# Patient Record
Sex: Female | Born: 1995 | Race: White | Hispanic: No | Marital: Married | Smoking: Current every day smoker
Health system: Southern US, Community
[De-identification: ages and names within clinical notes are randomized; demographics above are authoritative.]

## PROBLEM LIST (undated history)

## (undated) DIAGNOSIS — D72829 Elevated white blood cell count, unspecified: Secondary | ICD-10-CM

## (undated) DIAGNOSIS — K2970 Gastritis, unspecified, without bleeding: Secondary | ICD-10-CM

## (undated) DIAGNOSIS — Z87898 Personal history of other specified conditions: Secondary | ICD-10-CM

## (undated) DIAGNOSIS — F603 Borderline personality disorder: Secondary | ICD-10-CM

## (undated) DIAGNOSIS — M549 Dorsalgia, unspecified: Secondary | ICD-10-CM

## (undated) DIAGNOSIS — F4310 Post-traumatic stress disorder, unspecified: Secondary | ICD-10-CM

## (undated) DIAGNOSIS — F32A Depression, unspecified: Secondary | ICD-10-CM

## (undated) DIAGNOSIS — G932 Benign intracranial hypertension: Secondary | ICD-10-CM

## (undated) DIAGNOSIS — F419 Anxiety disorder, unspecified: Secondary | ICD-10-CM

## (undated) HISTORY — DX: Benign intracranial hypertension: G93.2

## (undated) HISTORY — DX: Depression, unspecified: F32.A

## (undated) HISTORY — PX: CHOLECYSTECTOMY: SHX55

## (undated) HISTORY — DX: Elevated white blood cell count, unspecified: D72.829

## (undated) HISTORY — DX: Gastritis, unspecified, without bleeding: K29.70

## (undated) HISTORY — DX: Borderline personality disorder: F60.3

## (undated) HISTORY — DX: Post-traumatic stress disorder, unspecified: F43.10

## (undated) HISTORY — DX: Personal history of other specified conditions: Z87.898

## (undated) HISTORY — DX: Anxiety disorder, unspecified: F41.9

## (undated) HISTORY — PX: LUMBAR PUNCTURE: SHX1985

---
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
## 2016-10-08 ENCOUNTER — Encounter (HOSPITAL_COMMUNITY): Payer: Self-pay

## 2016-10-08 ENCOUNTER — Emergency Department (HOSPITAL_COMMUNITY)
Admission: EM | Admit: 2016-10-08 | Discharge: 2016-10-08 | Disposition: A | Discharge status: Home / Self Care | Payer: Medicaid Other | Attending: Emergency Medicine | Admitting: Emergency Medicine

## 2016-10-08 ENCOUNTER — Emergency Department (HOSPITAL_COMMUNITY): Payer: Medicaid Other

## 2016-10-08 DIAGNOSIS — F17210 Nicotine dependence, cigarettes, uncomplicated: Secondary | ICD-10-CM | POA: Diagnosis not present

## 2016-10-08 DIAGNOSIS — O99331 Smoking (tobacco) complicating pregnancy, first trimester: Secondary | ICD-10-CM | POA: Insufficient documentation

## 2016-10-08 DIAGNOSIS — R102 Pelvic and perineal pain: Secondary | ICD-10-CM | POA: Diagnosis not present

## 2016-10-08 DIAGNOSIS — O039 Complete or unspecified spontaneous abortion without complication: Secondary | ICD-10-CM | POA: Diagnosis not present

## 2016-10-08 DIAGNOSIS — Z3A08 8 weeks gestation of pregnancy: Secondary | ICD-10-CM | POA: Insufficient documentation

## 2016-10-08 DIAGNOSIS — O209 Hemorrhage in early pregnancy, unspecified: Secondary | ICD-10-CM | POA: Diagnosis present

## 2016-10-08 DIAGNOSIS — N939 Abnormal uterine and vaginal bleeding, unspecified: Secondary | ICD-10-CM

## 2016-10-08 LAB — BASIC METABOLIC PANEL
Anion gap: 11 (ref 5–15)
BUN: 5 mg/dL — ABNORMAL LOW (ref 6–20)
CO2: 19 mmol/L — ABNORMAL LOW (ref 22–32)
Calcium: 9.3 mg/dL (ref 8.9–10.3)
Chloride: 107 mmol/L (ref 101–111)
Creatinine, Ser: 0.61 mg/dL (ref 0.44–1.00)
GFR calc Af Amer: >60 mL/min (ref >60)
GFR calc non Af Amer: >60 mL/min (ref >60)
Glucose, Bld: 89 mg/dL (ref 65–99)
Potassium: 4.2 mmol/L (ref 3.5–5.1)
Sodium: 137 mmol/L (ref 135–145)

## 2016-10-08 LAB — WET PREP, GENITAL
Sperm: NONE SEEN
Trich, Wet Prep: NONE SEEN
Yeast Wet Prep HPF POC: NONE SEEN

## 2016-10-08 LAB — CBC
HCT: 40.1 % (ref 36.0–46.0)
Hemoglobin: 13.2 g/dL (ref 12.0–15.0)
MCH: 30.6 pg (ref 26.0–34.0)
MCHC: 32.9 g/dL (ref 30.0–36.0)
MCV: 92.8 fL (ref 78.0–100.0)
Platelets: 224 10*3/uL (ref 150–400)
RBC: 4.32 MIL/uL (ref 3.87–5.11)
RDW: 12.7 % (ref 11.5–15.5)
WBC: 8.5 10*3/uL (ref 4.0–10.5)

## 2016-10-08 LAB — ABO/RH: ABO/RH(D): B POS

## 2016-10-08 LAB — HCG, QUANTITATIVE, PREGNANCY: hCG, Beta Chain, Quant, S: 1313 m[IU]/mL — ABNORMAL HIGH (ref <5)

## 2016-10-08 LAB — POC URINE PREG, ED: Preg Test, Ur: POSITIVE — AB

## 2016-10-08 MED ORDER — HYDROCODONE-ACETAMINOPHEN 5-325 MG PO TABS: 1.0000 | ORAL_TABLET | ORAL | 0 refills | Status: DC | PRN

## 2016-10-08 MED ORDER — OXYCODONE-ACETAMINOPHEN 5-325 MG PO TABS
2.0000 | ORAL_TABLET | Freq: Once | ORAL | Status: AC
Start: 2016-10-08 — End: 2016-10-08
  Administered 2016-10-08: 2 via ORAL
  Filled 2016-10-08: qty 2

## 2016-10-08 MED ORDER — IBUPROFEN 600 MG PO TABS: 600.0000 mg | ORAL_TABLET | Freq: Four times a day (QID) | ORAL | 0 refills | Status: DC | PRN

## 2016-10-08 MED ORDER — HYDROCODONE-ACETAMINOPHEN 5-325 MG PO TABS: 2.0000 | ORAL_TABLET | ORAL | 0 refills | Status: DC | PRN

## 2016-10-08 MED ORDER — MISOPROSTOL 200 MCG PO TABS
600.0000 ug | ORAL_TABLET | Freq: Once | ORAL | Status: AC
Start: 2016-10-08 — End: 2016-10-08
  Administered 2016-10-08: 600 ug via BUCCAL
  Filled 2016-10-08: qty 3

## 2016-10-08 MED ORDER — NAPROXEN 250 MG PO TABS
500.0000 mg | ORAL_TABLET | Freq: Once | ORAL | Status: AC
  Administered 2016-10-08: 500 mg via ORAL
  Filled 2016-10-08: qty 2

## 2016-10-08 NOTE — ED Notes (Signed)
Patient transported to Ultrasound 

## 2016-10-08 NOTE — ED Provider Notes (Signed)
[facility]-EMERGENCY DEPT Provider Note   CSN: [id number] Arrival date & time: 10/08/16  1237   By signing my name below, I, Soijett Blue, attest that this documentation has been prepared under the direction and in the presence of Shaune Pollack, MD. Electronically Signed: Soijett Blue, ED Scribe. 10/08/16. 2:42 PM.  History   Chief Complaint No chief complaint on file.   HPI Angela Boyd is a 21 y.o. female who presents to the Emergency Department complaining of vaginal bleeding s/p sexual intercourse onset yesterday. She notes that she had light vaginal bleeding x yesterday morning that was heavy initially and then resolved. Pt reports associated intermittent lower abdominal cramping, passing small clots, and dyspareunia. Pt has not tried any medications for the relief of her symptoms. She notes that she is approximately [redacted] weeks pregnant and has had recent life stressors. 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 She notes that this is her 4th pregnancy, with 1 live child, and 2 miscarriages. Pt reports that she had an [place] at 8 weeks with no abnormal results. She reports that her current symptoms are not similar to when she was experiencing her miscarriages. Pt states that she had irregular menstrual cycles and notes that she has a hx of anemia in pregnancy. She states that she was dx with an UTI and treated with abx several weeks ago. She denies fever, chills, dysuria, flank pain, leg swelling, and any other symptoms.     The history is provided by the patient. No language interpreter was used.    Past Medical History:  Diagnosis Date  . Back pain     There are no active problems to display for this patient.   History reviewed. No pertinent surgical history.  OB History    Gravida Para Term Preterm AB Living   1             SAB TAB Ectopic Multiple Live Births                   Home Medications     Prior to Admission medications   Medication Sig Start Date End Date Taking? Authorizing Provider  amoxicillin (AMOXIL) 500 MG capsule Take 500 mg by mouth 3 (three) times daily.      [provider]  ARIPiprazole (ABILIFY) 5 MG tablet Take 5 mg by mouth at bedtime.      [provider]  cyclobenzaprine (FLEXERIL) 10 MG tablet Take 1/2 to one tab po TID prn back pain 05/19/11   Worthy Rancher, PA-C  HYDROcodone-acetaminophen (NORCO/VICODIN) 5-325 MG tablet Take 1-2 tablets by mouth every 4 (four) hours as needed for moderate pain or severe pain. 10/08/16   Shaune Pollack, MD  ibuprofen (ADVIL,MOTRIN) 600 MG tablet Take 1 tablet (600 mg total) by mouth every 6 (six) hours as needed. 10/08/16   Shaune Pollack, MD  Pediatric Multivit-Minerals-C (FLINTSTONES GUMMIES PLUS) CHEW Chew 2 each by mouth daily.      [provider]    Family History History reviewed. No pertinent family history.  Social History Social History  Substance Use Topics  . Smoking status: Current Every Day Smoker    Packs/day: 0.50    Types: Cigarettes  . Smokeless tobacco: Never Used  . Alcohol use No     Allergies   Patient has no known allergies.   Review of Systems Review of Systems  Constitutional: Negative for  chills and fever.  Cardiovascular: Negative for leg swelling.  Gastrointestinal: Positive for abdominal pain.  Genitourinary: Positive for dyspareunia, pelvic pain and vaginal bleeding. Negative for dysuria and flank pain.     Physical Exam Updated Vital Signs BP 114/75 (BP Location: Right Arm)   Pulse (!) 103   Temp 99 F (37.2 C) (Oral)   Resp 16   LMP 07/15/2016   SpO2 99%   Physical Exam  Constitutional: She is oriented to person, place, and time. She appears well-developed and well-nourished. No distress.  HENT:  Head: Normocephalic and atraumatic.  Eyes: EOM are normal.  Neck: Neck supple.  Cardiovascular: Normal rate, regular rhythm and normal  heart sounds.  Exam reveals no gallop and no friction rub.   No murmur heard. Pulmonary/Chest: Effort normal and breath sounds normal. No respiratory distress. She has no wheezes. She has no rales.  Abdominal: Soft. She exhibits no distension. There is tenderness (suprapubic).  Genitourinary:  Genitourinary Comments: Nurse chaperone present for exam. Cervical os closed but with moderate volume dark red blood in vaginal vault. Mild CMT. No discharge. No lacerations.  Musculoskeletal: Normal range of motion.  Neurological: She is alert and oriented to person, place, and time.  Skin: Skin is warm and dry.  Psychiatric: She has a normal mood and affect. Her behavior is normal.  Nursing note and vitals reviewed.    ED Treatments / Results  DIAGNOSTIC STUDIES: Oxygen Saturation is 99% on RA, nl by my interpretation.    COORDINATION OF CARE: 2:40 PM Discussed treatment plan with pt at bedside and pt agreed to plan.   Labs (all labs ordered are listed, but only abnormal results are displayed) Labs Reviewed  WET PREP, GENITAL - Abnormal; Notable for the following:       Result Value   Clue Cells Wet Prep HPF POC PRESENT (*)    WBC, Wet Prep HPF POC FEW (*)    All other components within normal limits  HCG, QUANTITATIVE, PREGNANCY - Abnormal; Notable for the following:    hCG, Beta Chain, Quant, S 1,313 (*)    All other components within normal limits  BASIC METABOLIC PANEL - Abnormal; Notable for the following:    CO2 19 (*)    BUN 5 (*)    All other components within normal limits  POC URINE PREG, ED - Abnormal; Notable for the following:    Preg Test, Ur POSITIVE (*)    All other components within normal limits  CBC  ABO/RH  GC/CHLAMYDIA PROBE AMP ([place]    EKG  EKG Interpretation None       Radiology [place] Ob Comp < 14 Wks  Result Date: 10/08/2016 CLINICAL DATA:  Vaginal bleeding and cramping since yesterday. Eight weeks pregnant. EXAM: OBSTETRIC <14  WK US AND TRANSVAGINAL OB US TECHNIQUE: Both transabdominal and transvaginal ultrasound examinations were performed for complete evaluation of the gestation as well as the maternal uterus, adnexal regions, and pelvic cul-de-sac. Transvaginal technique was performed to assess early pregnancy. COMPARISON:  09/12/2016 FINDINGS: Intrauterine gestational sac: Single Yolk sac:  Not visualized Embryo:  Visualized Cardiac Activity: Not Visualized. Heart Rate: Not detected CRL:  1.63 cm    8 w   0 d                  US EDC: 05/20/2017 Subchorionic hemorrhage:  None visualized. Maternal uterus/adnexae: Gestational sac is somewhat oblong and irregular in shape. Embryo is demonstrated but no detectable or  visualized cardiac activity. Findings compatible with failed pregnancy. no subchronic hemorrhage. No free fluid. No other adnexal abnormality. Right ovary measures 3.2 x 2.0 x 2.0 cm. Left ovary measures 3.1 x 2.1 x 2.1 cm. IMPRESSION: Abnormal appearing gestational sac with a fetal pole demonstrated a CRL of 1.63 cm. No detectable cardiac activity or heart rate. Findings meet definitive criteria for failed pregnancy. This follows SRU consensus guidelines: Diagnostic Criteria for Nonviable Pregnancy Early in the First Trimester. [facility] J [facility] [phone]. These results were called by telephone at the time of interpretation on 10/08/2016 at 4:58 pm to Dr. Shaune Pollack , who verbally acknowledged these results. Electronically Signed   By: Judie Petit.  Shick M.D.   On: 10/08/2016 17:00   US Ob Transvaginal  Result Date: 10/08/2016 CLINICAL DATA:  Vaginal bleeding and cramping since yesterday. Eight weeks pregnant. EXAM: OBSTETRIC <14 WK [place] AND TRANSVAGINAL OB US TECHNIQUE: Both transabdominal and transvaginal ultrasound examinations were performed for complete evaluation of the gestation as well as the maternal uterus, adnexal regions, and pelvic cul-de-sac. Transvaginal technique was performed to assess early pregnancy.  COMPARISON:  09/12/2016 FINDINGS: Intrauterine gestational sac: Single Yolk sac:  Not visualized Embryo:  Visualized Cardiac Activity: Not Visualized. Heart Rate: Not detected CRL:  1.63 cm    8 w   0 d                  [place] EDC: 05/20/2017 Subchorionic hemorrhage:  None visualized. Maternal uterus/adnexae: Gestational sac is somewhat oblong and irregular in shape. Embryo is demonstrated but no detectable or visualized cardiac activity. Findings compatible with failed pregnancy. no subchronic hemorrhage. No free fluid. No other adnexal abnormality. Right ovary measures 3.2 x 2.0 x 2.0 cm. Left ovary measures 3.1 x 2.1 x 2.1 cm. IMPRESSION: Abnormal appearing gestational sac with a fetal pole demonstrated a CRL of 1.63 cm. No detectable cardiac activity or heart rate. Findings meet definitive criteria for failed pregnancy. This follows SRU consensus guidelines: Diagnostic Criteria for Nonviable Pregnancy Early in the First Trimester. [facility] J Med [phone]. These results were called by telephone at the time of interpretation on 10/08/2016 at 4:58 pm to Dr. Shaune Pollack , who verbally acknowledged these results. Electronically Signed   By: Judie Petit.  Shick M.D.   On: 10/08/2016 17:00    Procedures Procedures (including critical care time)  Medications Ordered in ED Medications  oxyCODONE-acetaminophen (PERCOCET/ROXICET) 5-325 MG per tablet 2 tablet (2 tablets Oral Given 10/08/16 1731)  naproxen (NAPROSYN) tablet 500 mg (500 mg Oral Given 10/08/16 1731)  misoprostol (CYTOTEC) tablet 600 mcg (600 mcg Buccal Given 10/08/16 1803)     Initial Impression / Assessment and Plan / ED Course  I have reviewed the triage vital signs and the nursing notes.  Pertinent labs & imaging results that were available during my care of the patient were reviewed by me and considered in my medical decision making (see chart for details).   21 yo G4P1030 at estimated [redacted] wk GA here with abdominal pain, vaginal bleeding, and  cramping. Unfortunately, TVUS confirms failed, nonviable pregnancy. Suspect her pain, bleeding, and cramping is 2/2 beginning to pass the POC. No evidence of septic AB. Pt o.w HDS, no signs of significant bleeding. I discussed with Dr. Adrian Blackwater of OBGYN - will give cytotec oral dose here, refer for outpt OB follow-up. Discussed risks/benefits/side effects of tx, including need to monitor bleeding and pain. Pt given analgesia and meds. Good return precautions given. I sat with pt, husband  for some time to discuss and reassure them in setting of this news.  Final Clinical Impressions(s) / ED Diagnoses   Final diagnoses:  Vaginal bleeding  Miscarriage    New Prescriptions Discharge Medication List as of 10/08/2016  5:59 PM    START taking these medications   Details  HYDROcodone-acetaminophen (NORCO/VICODIN) 5-325 MG tablet Take 2 tablets by mouth every 4 (four) hours as needed for moderate pain or severe pain., Starting Tue 10/08/2016, Print    ibuprofen (ADVIL,MOTRIN) 600 MG tablet Take 1 tablet (600 mg total) by mouth every 6 (six) hours as needed., Starting Tue 10/08/2016, Print        I personally performed the services described in this documentation, which was scribed in my presence. The recorded information has been reviewed and is accurate.     Shaune Pollack, MD 10/09/16 1019

## 2016-10-08 NOTE — ED Triage Notes (Signed)
Pt reports she is approximately [redacted]weeks pregnant. Began having vaginal bleeding yesterday. Pt reports some abd cramping as well. Hx of miscarriage previously.

## 2016-10-08 NOTE — ED Notes (Signed)
Waiting for pharmacy to send medication

## 2016-10-09 LAB — GC/CHLAMYDIA PROBE AMP (~~LOC~~) NOT AT ARMC
Chlamydia: NEGATIVE
Neisseria Gonorrhea: NEGATIVE

## 2016-10-16 ENCOUNTER — Encounter (HOSPITAL_COMMUNITY): Payer: Self-pay | Admitting: *Deleted

## 2016-10-16 ENCOUNTER — Emergency Department (HOSPITAL_COMMUNITY)
Admission: EM | Admit: 2016-10-16 | Discharge: 2016-10-16 | Disposition: A | Discharge status: Home / Self Care | Payer: Medicaid Other | Attending: Emergency Medicine | Admitting: Emergency Medicine

## 2016-10-16 DIAGNOSIS — F17210 Nicotine dependence, cigarettes, uncomplicated: Secondary | ICD-10-CM | POA: Insufficient documentation

## 2016-10-16 DIAGNOSIS — O034 Incomplete spontaneous abortion without complication: Secondary | ICD-10-CM

## 2016-10-16 DIAGNOSIS — N939 Abnormal uterine and vaginal bleeding, unspecified: Secondary | ICD-10-CM | POA: Diagnosis present

## 2016-10-16 LAB — CBC WITH DIFFERENTIAL/PLATELET
Basophils Absolute: 0 10*3/uL (ref 0.0–0.1)
Basophils Relative: 0 %
Eosinophils Absolute: 0.4 10*3/uL (ref 0.0–0.7)
Eosinophils Relative: 4 %
HCT: 34.8 % — ABNORMAL LOW (ref 36.0–46.0)
Hemoglobin: 11.4 g/dL — ABNORMAL LOW (ref 12.0–15.0)
Lymphocytes Relative: 32 %
Lymphs Abs: 2.7 10*3/uL (ref 0.7–4.0)
MCH: 30.3 pg (ref 26.0–34.0)
MCHC: 32.8 g/dL (ref 30.0–36.0)
MCV: 92.6 fL (ref 78.0–100.0)
Monocytes Absolute: 0.4 10*3/uL (ref 0.1–1.0)
Monocytes Relative: 5 %
Neutro Abs: 5 10*3/uL (ref 1.7–7.7)
Neutrophils Relative %: 59 %
Platelets: 275 10*3/uL (ref 150–400)
RBC: 3.76 MIL/uL — ABNORMAL LOW (ref 3.87–5.11)
RDW: 12.7 % (ref 11.5–15.5)
WBC: 8.4 10*3/uL (ref 4.0–10.5)

## 2016-10-16 LAB — URINALYSIS, ROUTINE W REFLEX MICROSCOPIC
Bilirubin Urine: NEGATIVE
Glucose, UA: NEGATIVE mg/dL
Ketones, ur: NEGATIVE mg/dL
Leukocytes, UA: NEGATIVE
Nitrite: NEGATIVE
Protein, ur: 100 mg/dL — AB
Specific Gravity, Urine: 1.003 — ABNORMAL LOW (ref 1.005–1.030)
pH: 7 (ref 5.0–8.0)

## 2016-10-16 LAB — COMPREHENSIVE METABOLIC PANEL
ALT: 47 U/L (ref 14–54)
AST: 27 U/L (ref 15–41)
Albumin: 3.8 g/dL (ref 3.5–5.0)
Alkaline Phosphatase: 66 U/L (ref 38–126)
Anion gap: 10 (ref 5–15)
BUN: <5 mg/dL — ABNORMAL LOW (ref 6–20)
CO2: 22 mmol/L (ref 22–32)
Calcium: 9.3 mg/dL (ref 8.9–10.3)
Chloride: 107 mmol/L (ref 101–111)
Creatinine, Ser: 0.7 mg/dL (ref 0.44–1.00)
GFR calc Af Amer: >60 mL/min (ref >60)
GFR calc non Af Amer: >60 mL/min (ref >60)
Glucose, Bld: 106 mg/dL — ABNORMAL HIGH (ref 65–99)
Potassium: 3.5 mmol/L (ref 3.5–5.1)
Sodium: 139 mmol/L (ref 135–145)
Total Bilirubin: 0.4 mg/dL (ref 0.3–1.2)
Total Protein: 6.7 g/dL (ref 6.5–8.1)

## 2016-10-16 LAB — I-STAT BETA HCG BLOOD, ED (MC, WL, AP ONLY): I-stat hCG, quantitative: 126.6 m[IU]/mL — ABNORMAL HIGH (ref <5)

## 2016-10-16 LAB — HCG, QUANTITATIVE, PREGNANCY: hCG, Beta Chain, Quant, S: 128 m[IU]/mL — ABNORMAL HIGH (ref <5)

## 2016-10-16 LAB — TYPE AND SCREEN
ABO/RH(D): B POS
Antibody Screen: NEGATIVE

## 2016-10-16 LAB — POC URINE PREG, ED: Preg Test, Ur: POSITIVE — AB

## 2016-10-16 MED ORDER — ONDANSETRON HCL 4 MG/2ML IJ SOLN
4.0000 mg | Freq: Once | INTRAMUSCULAR | Status: AC
  Administered 2016-10-16: 4 mg via INTRAVENOUS
  Filled 2016-10-16: qty 2

## 2016-10-16 MED ORDER — SODIUM CHLORIDE 0.9 % IV BOLUS (SEPSIS)
1000.0000 mL | Freq: Once | INTRAVENOUS | Status: AC
  Administered 2016-10-16: 1000 mL via INTRAVENOUS

## 2016-10-16 MED ORDER — HYDROMORPHONE HCL 1 MG/ML IJ SOLN
1.0000 mg | Freq: Once | INTRAMUSCULAR | Status: AC
  Administered 2016-10-16: 1 mg via INTRAVENOUS
  Filled 2016-10-16: qty 1

## 2016-10-16 MED ORDER — MISOPROSTOL 200 MCG PO TABS
600.0000 ug | ORAL_TABLET | Freq: Once | ORAL | Status: AC
  Administered 2016-10-16: 600 ug via ORAL
  Filled 2016-10-16: qty 3

## 2016-10-16 MED ORDER — OXYCODONE-ACETAMINOPHEN 5-325 MG PO TABS: 1.0000 | ORAL_TABLET | ORAL | 0 refills | Status: AC | PRN

## 2016-10-16 MED ORDER — HYDROMORPHONE HCL 1 MG/ML IJ SOLN
1.0000 mg | Freq: Once | INTRAMUSCULAR | Status: AC
Start: 2016-10-16 — End: 2016-10-16
  Administered 2016-10-16: 1 mg via INTRAVENOUS
  Filled 2016-10-16: qty 1

## 2016-10-16 NOTE — ED Notes (Signed)
Family at bedside. 

## 2016-10-16 NOTE — ED Provider Notes (Signed)
[facility]-EMERGENCY DEPT Provider Note   CSN: [id number] Arrival date & time: 10/16/16  0038  By signing my name below, I, Rosario AdieWilliam Andrew Hiatt, attest that this documentation has been prepared under the direction and in the presence of Day Deery, Canary Brimhristopher J, MD. Electronically Signed: Rosario AdieWilliam Andrew Hiatt, ED Scribe. 10/16/16. 1:17 AM.  History   Chief Complaint Chief Complaint  Patient presents with  . Vaginal Bleeding   The history is provided by the patient and medical records. No language interpreter was used.   HPI Comments: Angela Boyd is a 21 y.o. female BIB EMS, with no pertinent PMHx, who presents to the Emergency Department complaining of increased vaginal bleeding beginning last week, acutely worsening tonight prior to arrival. Pt was seen in the ED for same on 05/29, at that time she was dx'd w/ a miscarriage. She states that following this she had a heavy flow for several dats which began to improve; however two days ago she had a persistent cough with some recurrence of worsening bleeding which also began to improve over the past several days. Tonight while laying in bed she had a bout of heavy coughs and at that time had worsening bleeding which she was unable to control, prompting her to come into the ED. She reports associated dizziness secondary to this. She also endorses some pelvic pain and states that she has been passing clots. She denies nausea, vomiting, or any other associated symptoms.   Past Medical History:  Diagnosis Date  . Back pain    There are no active problems to display for this patient.  History reviewed. No pertinent surgical history.  OB History    Gravida Para Term Preterm AB Living   1             SAB TAB Ectopic Multiple Live Births                 Home Medications    Prior to Admission medications   Medication Sig Start Date End Date Taking? Authorizing Provider  cyclobenzaprine (FLEXERIL) 10 MG tablet Take 1/2 to one tab po TID prn  back pain Patient not taking: Reported on 10/16/2016 05/19/11   Worthy RancherMiller, Richard M, PA-C  HYDROcodone-acetaminophen (NORCO/VICODIN) 5-325 MG tablet Take 1-2 tablets by mouth every 4 (four) hours as needed for moderate pain or severe pain. Patient not taking: Reported on 10/16/2016 10/08/16   Shaune PollackIsaacs, Cameron, MD  ibuprofen (ADVIL,MOTRIN) 600 MG tablet Take 1 tablet (600 mg total) by mouth every 6 (six) hours as needed. Patient not taking: Reported on 10/16/2016 10/08/16   Shaune PollackIsaacs, Cameron, MD   Family History History reviewed. No pertinent family history.  Social History Social History  Substance Use Topics  . Smoking status: Current Every Day Smoker    Packs/day: 0.50    Types: Cigarettes  . Smokeless tobacco: Never Used  . Alcohol use No   Allergies   Patient has no known allergies.  Review of Systems Review of Systems  Respiratory: Positive for cough.   Gastrointestinal: Negative for nausea and vomiting.  Genitourinary: Positive for pelvic pain and vaginal bleeding.  Neurological: Positive for dizziness.   Physical Exam Updated Vital Signs BP 104/63   Pulse 72   Temp 99.2 F (37.3 C)   Resp 16   LMP 07/15/2016   SpO2 98%   Breastfeeding? Unknown   Physical Exam  Constitutional: She is oriented to person, place, and time. She appears well-developed and well-nourished. No distress.  HENT:  Head: Normocephalic and  atraumatic.  Right Ear: Hearing normal.  Left Ear: Hearing normal.  Nose: Nose normal.  Mouth/Throat: Oropharynx is clear and moist and mucous membranes are normal.  Eyes: Conjunctivae and EOM are normal. Pupils are equal, round, and reactive to light.  Neck: Normal range of motion. Neck supple.  Cardiovascular: Regular rhythm, S1 normal and S2 normal.  Exam reveals no gallop and no friction rub.   No murmur heard. Pulmonary/Chest: Effort normal and breath sounds normal. No respiratory distress. She exhibits no tenderness.  Abdominal: Soft. Normal appearance and  bowel sounds are normal. There is no hepatosplenomegaly. There is no tenderness. There is no rebound, no guarding, no tenderness at McBurney's point and negative Murphy's sign. No hernia.  Genitourinary:  Genitourinary Comments: Partially dilated cervix with clot and small amount of tissue in the cervix  Uterus not significantly enlarged or tender, no adnexal masses or tenderness  Musculoskeletal: Normal range of motion.  Neurological: She is alert and oriented to person, place, and time. She has normal strength. No cranial nerve deficit or sensory deficit. Coordination normal. GCS eye subscore is 4. GCS verbal subscore is 5. GCS motor subscore is 6.  Skin: Skin is warm, dry and intact. No rash noted. No cyanosis.  Psychiatric: She has a normal mood and affect. Her speech is normal and behavior is normal. Thought content normal.  Nursing note and vitals reviewed.  ED Treatments / Results  DIAGNOSTIC STUDIES: Oxygen Saturation is 99% on RA, normal by my interpretation.   COORDINATION OF CARE: 1:16 AM-Discussed next steps with pt. Pt verbalized understanding and is agreeable with the plan.   Labs (all labs ordered are listed, but only abnormal results are displayed) Labs Reviewed  URINALYSIS, ROUTINE W REFLEX MICROSCOPIC - Abnormal; Notable for the following:       Result Value   Color, Urine RED (*)    APPearance HAZY (*)    Specific Gravity, Urine 1.003 (*)    Hgb urine dipstick LARGE (*)    Protein, ur 100 (*)    Bacteria, UA RARE (*)    Squamous Epithelial / LPF 0-5 (*)    All other components within normal limits  CBC WITH DIFFERENTIAL/PLATELET - Abnormal; Notable for the following:    RBC 3.76 (*)    Hemoglobin 11.4 (*)    HCT 34.8 (*)    All other components within normal limits  COMPREHENSIVE METABOLIC PANEL - Abnormal; Notable for the following:    Glucose, Bld 106 (*)    BUN <5 (*)    All other components within normal limits  HCG, QUANTITATIVE, PREGNANCY - Abnormal;  Notable for the following:    hCG, Beta Chain, Quant, S 128 (*)    All other components within normal limits  POC URINE PREG, ED - Abnormal; Notable for the following:    Preg Test, Ur POSITIVE (*)    All other components within normal limits  I-STAT BETA HCG BLOOD, ED ([facility], [facility], [facility] ONLY) - Abnormal; Notable for the following:    I-stat hCG, quantitative 126.6 (*)    All other components within normal limits  TYPE AND SCREEN   EKG  EKG Interpretation None      Radiology No results found.  Procedures Procedures  Medications Ordered in ED Medications  sodium chloride 0.9 % bolus 1,000 mL (0 mLs Intravenous Stopped 10/16/16 0230)  HYDROmorphone (DILAUDID) injection 1 mg (1 mg Intravenous Given 10/16/16 0127)  ondansetron (ZOFRAN) injection 4 mg (4 mg Intravenous Given 10/16/16 0126)  HYDROmorphone (DILAUDID) injection 1 mg (1 mg Intravenous Given 10/16/16 0410)  misoprostol (CYTOTEC) tablet 600 mcg (600 mcg Oral Given 10/16/16 0425)    Initial Impression / Assessment and Plan / ED Course  I have reviewed the triage vital signs and the nursing notes.  Pertinent labs & imaging results that were available during my care of the patient were reviewed by me and considered in my medical decision making (see chart for details).     Patient was seen approximately 1 week ago with vaginal bleeding and pelvic pain in early pregnancy. Ultrasound at that time revealed nonviable fetus. Patient was treated with Cytotec at recommendation of OB/GYN and discharged. She reports bleeding for 1 or 2 days with improvement, now in the last 24 hours has had increasing bleeding and pelvic pain. Tonight she has been passing large clots and has severe pain.  Patient is hemodynamically stable. Hemoglobin has dropped from previous visit, but is still above 11. No significant anemia or ongoing hemorrhage currently. Pelvic exam revealed clot and tissue in the cervix that I could only partially removed with ring forceps.  Discussed with Dr. Adrian Blackwater, on call for OB/GYN. He recommends repeating Cytotec, pain control and can follow-up as an outpatient.  Final Clinical Impressions(s) / ED Diagnoses   Final diagnoses:  Incomplete miscarriage   New Prescriptions New Prescriptions   No medications on file   I personally performed the services described in this documentation, which was scribed in my presence. The recorded information has been reviewed and is accurate.     Gilda Crease, MD 10/16/16 [phone]

## 2016-10-16 NOTE — ED Triage Notes (Signed)
Pt arrives from home via [facility]. Pt was dx with a miscarriage last Tuesday and was given "a medicine to help having the baby pass more quickly". Pt states she's uncertain if she's passed the baby and began bleeding again this evening after coughing. Pt endorses pelvic pain and states she's passing clots.

## 2016-10-16 NOTE — ED Notes (Signed)
ED Provider at bedside. 

## 2019-03-17 IMAGING — US US OB COMP LESS 14 WK
1 series · 13 of 28 positions shown · non-contrast
Comparison: 09/12/2016

CLINICAL DATA: Vaginal bleeding and cramping since yesterday. Eight
weeks pregnant.

EXAM:
OBSTETRIC <14 WK US AND TRANSVAGINAL OB US
TECHNIQUE: Both transabdominal and transvaginal ultrasound examinations were
performed for complete evaluation of the gestation as well as the
maternal uterus, adnexal regions, and pelvic cul-de-sac.
Transvaginal technique was performed to assess early pregnancy.

[Series 1: us ob comp less 14 wk · 0.23mm/px · 13 of 86 slices shown]
[im 4/86]
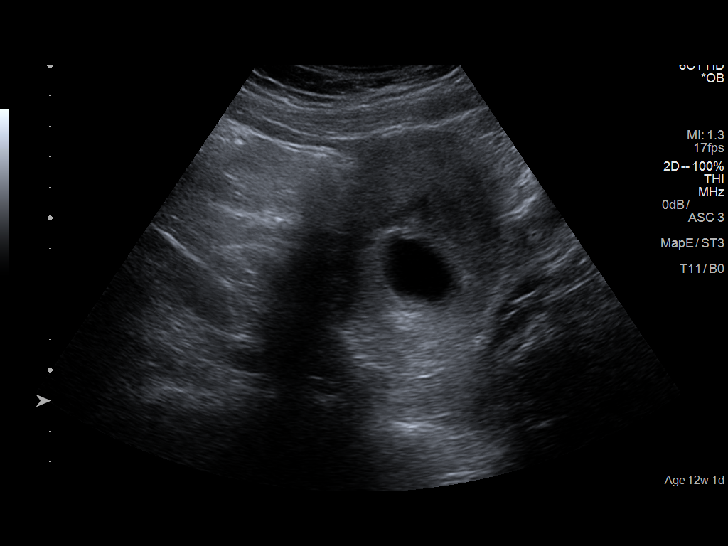
[im 10/86]
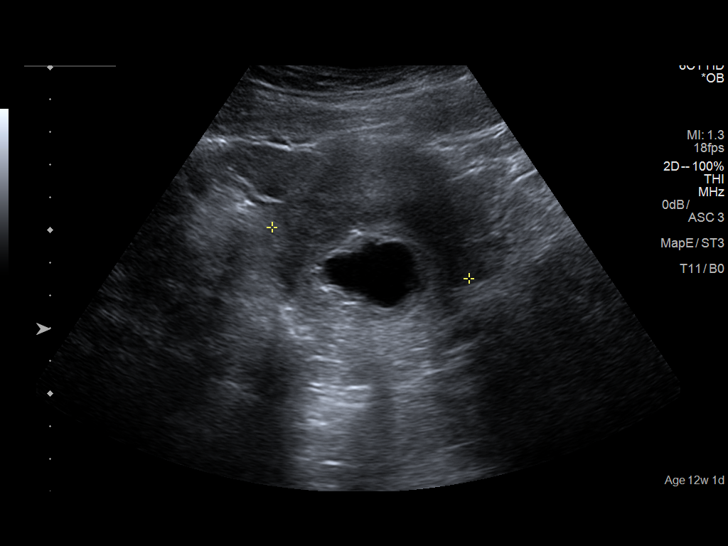
[im 16/86]
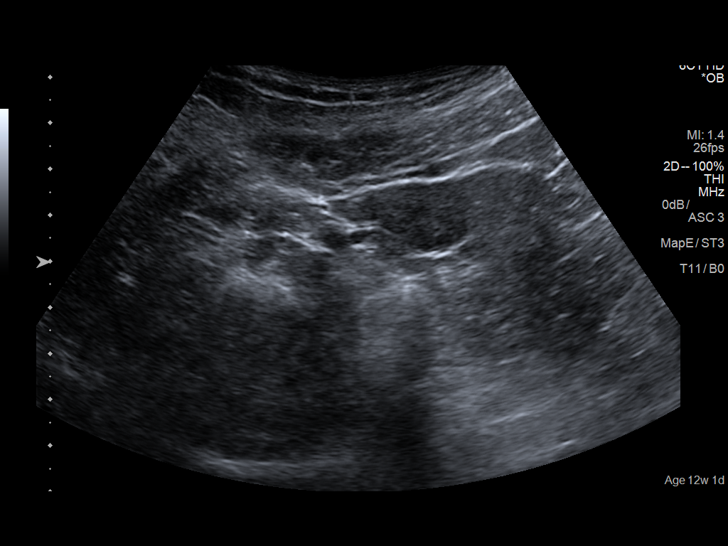
[im 23/86]
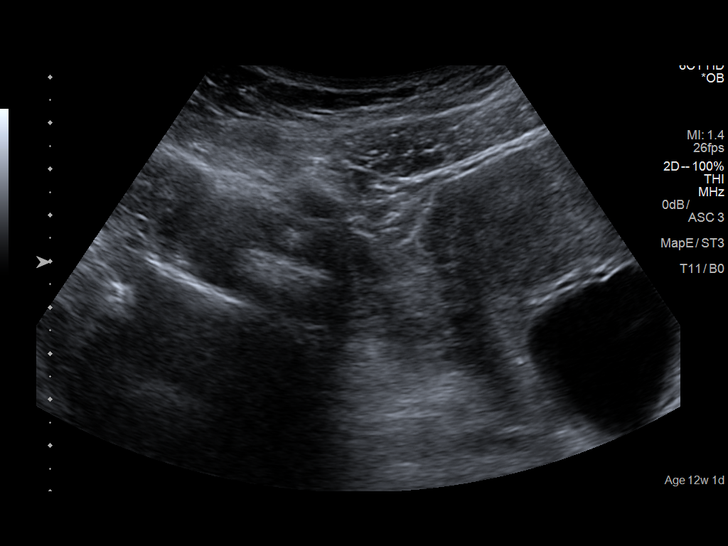
[im 29/86]
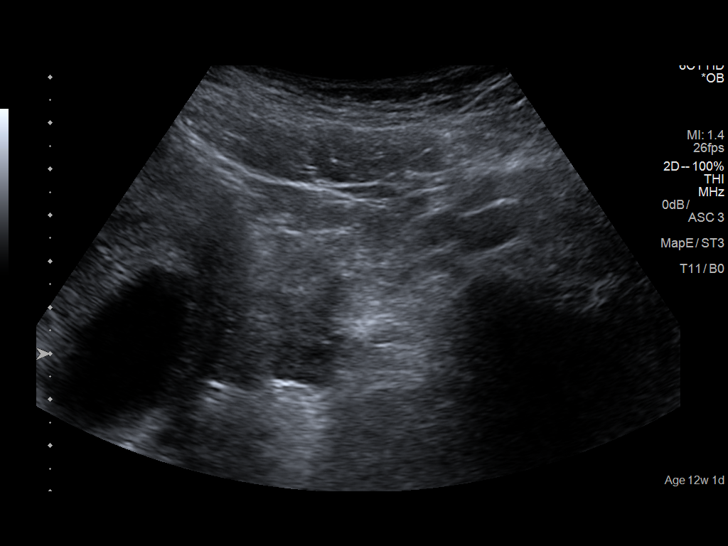
[im 35/86]
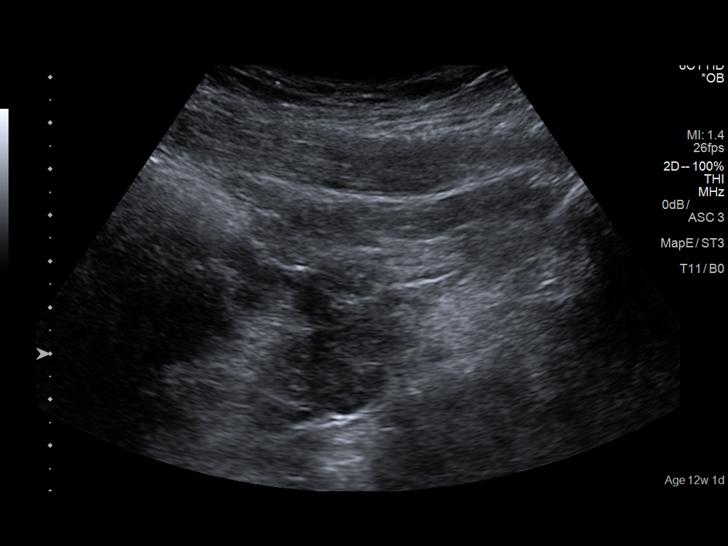
[im 45/86]
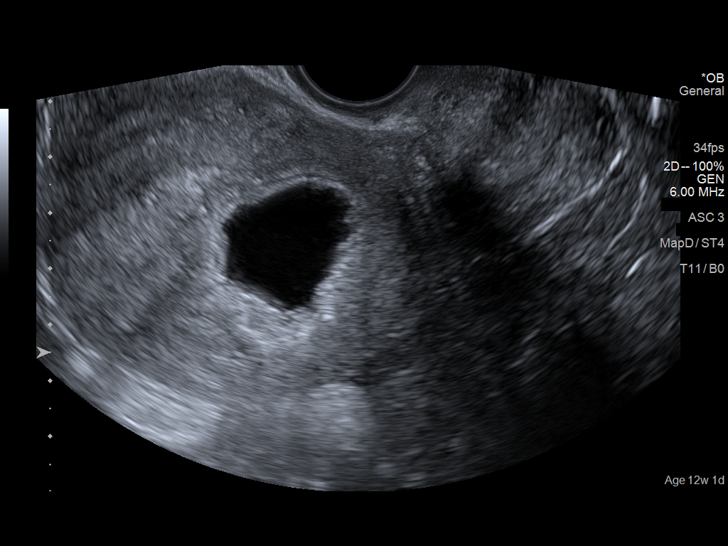
[im 51/86]
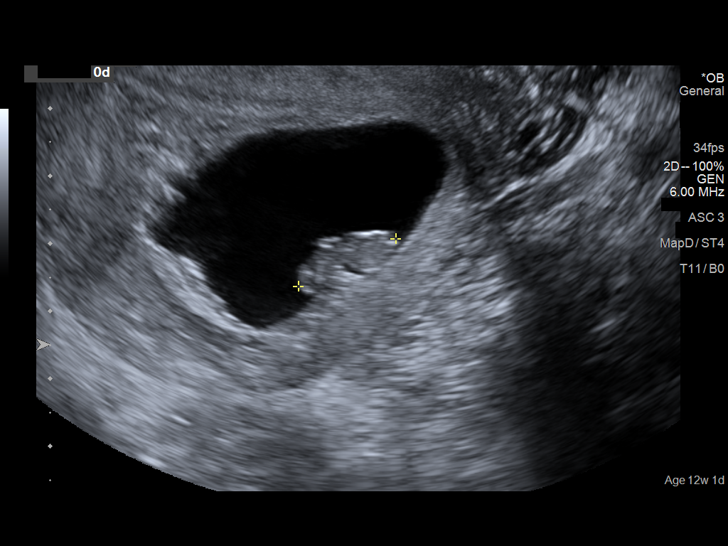
[im 57/86]
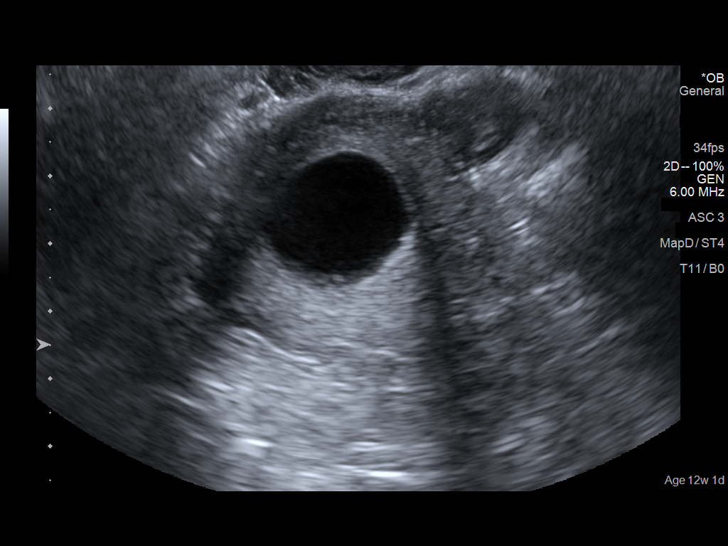
[im 63/86]
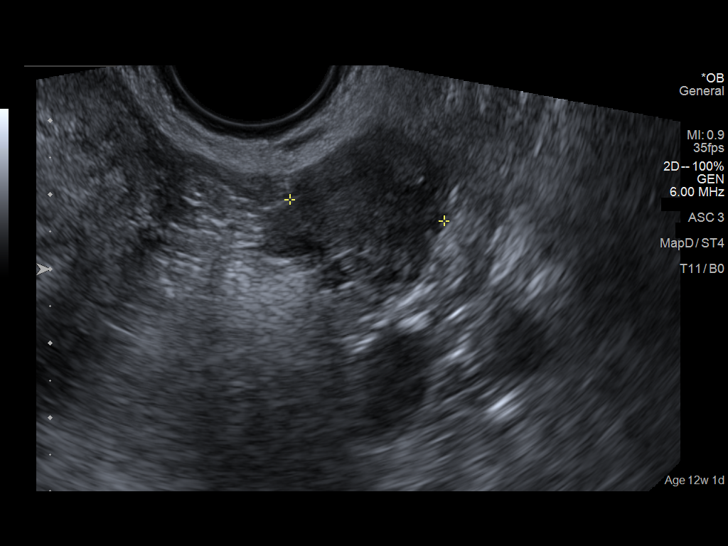
[im 70/86]
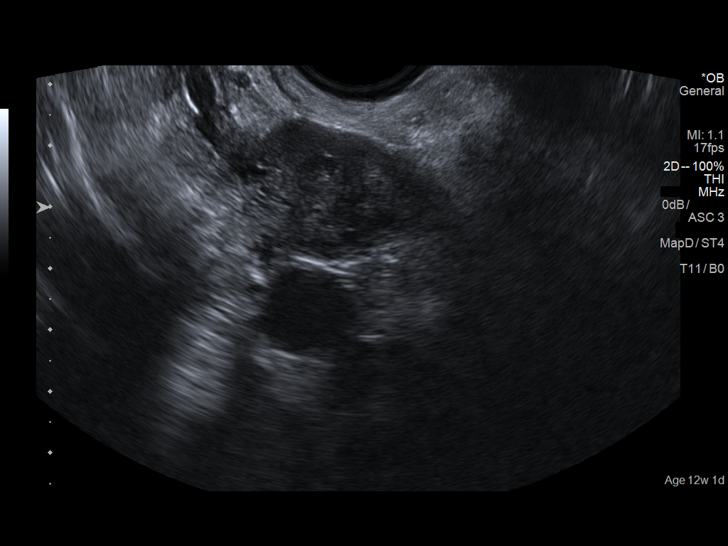
[im 76/86]
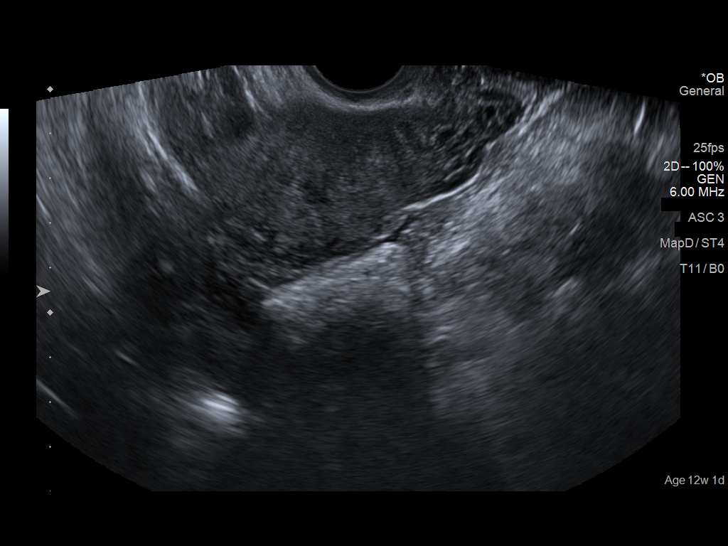
[im 82/86]
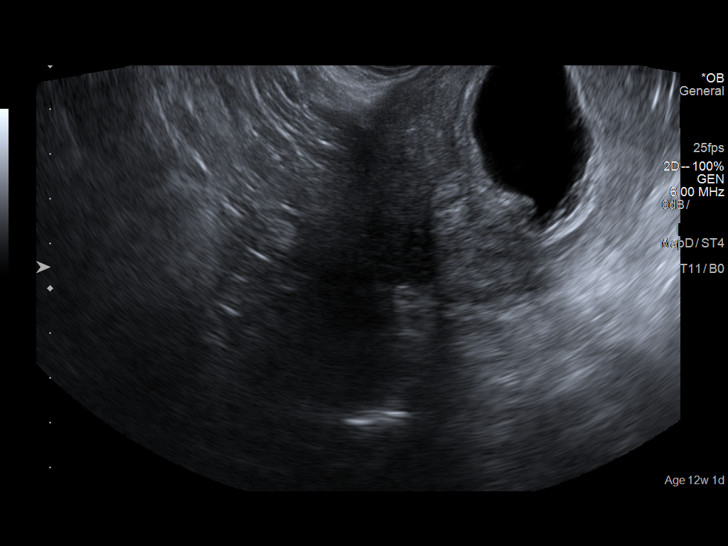

[13 of 28 positions shown; findings below may reference images not displayed]

FINDINGS: Intrauterine gestational sac: Single

Yolk sac:  Not visualized

Embryo:  Visualized

Cardiac Activity: Not Visualized.

Heart Rate: Not detected

CRL:  1.63 cm    8 w   0 d                  US EDC: 05/20/2017

Subchorionic hemorrhage:  None visualized.

Maternal uterus/adnexae: Gestational sac is somewhat oblong and
irregular in shape. Embryo is demonstrated but no detectable or
visualized cardiac activity. Findings compatible with failed
pregnancy. no subchronic hemorrhage. No free fluid. No other adnexal
abnormality.

Right ovary measures 3.2 x 2.0 x 2.0 cm. Left ovary measures 3.1 x
2.1 x 2.1 cm.
IMPRESSION: Abnormal appearing gestational sac with a fetal pole demonstrated a
CRL of 1.63 cm. No detectable cardiac activity or heart rate.
Findings meet definitive criteria for failed pregnancy. This follows
SRU consensus guidelines: Diagnostic Criteria for Nonviable
Pregnancy Early in the First Trimester. N Engl J Med
0775;[DATE].

These results were called by telephone at the time of interpretation
on 10/08/2016 at [DATE] to Dr. MELYNDA BILLIOT , who verbally
acknowledged these results.

## 2024-05-16 ENCOUNTER — Other Ambulatory Visit: Payer: Self-pay | Admitting: Oncology

## 2024-05-16 DIAGNOSIS — R7989 Other specified abnormal findings of blood chemistry: Secondary | ICD-10-CM

## 2024-05-16 NOTE — Progress Notes (Unsigned)
 " [facility] at [facility] [address] [place],  [place]  [address] [phone]  Clinic Day: 05/17/2024  Referring physician: Ofilia Lamar CROME, MD   HISTORY OF PRESENT ILLNESS:  The patient is a 29 y.o. female who I was asked to consult upon as a peripheral smear done of her CBC in the [facility] Emergency Room 2 weekends ago apparently revealed  blasts.  She comes in today for further follow-up.  According to the patient, she presented to the emergency room a few weeks ago due to having left-sided abdominal pain over the past month.  Over the past few months, the patient claims to have weekly night sweats. She has also unintentionally lost 30 pounds over the past few months.  Her husband has also noticed her being more fatigued.  She denies having any subjective fevers.  She also denies having any recent illnesses or infections.  To her knowledge, there is no family history of any hematologic disorders.  PAST MEDICAL HISTORY:   Past Medical History:  Diagnosis Date   Anxiety    Back pain    Borderline personality disorder (HCC)    Depression    Elevated WBC count    Gastritis    History of headache    Pseudotumor cerebri    PTSD (post-traumatic stress disorder)     PAST SURGICAL HISTORY:   Past Surgical History:  Procedure Laterality Date   CHOLECYSTECTOMY     LUMBAR PUNCTURE      CURRENT MEDICATIONS:   Current Outpatient Medications  Medication Sig Dispense Refill   lithium carbonate (LITHOBID) 300 MG ER tablet TAKE ONE TABLET BY MOUTH THREE TIMES DAILY FOR MOOD     metoCLOPramide (REGLAN) 10 MG tablet Take 10 mg by mouth 4 (four) times daily.     venlafaxine XR (EFFEXOR-XR) 150 MG 24 hr capsule TAKE 1 CAPSULE BY MOUTH ONCE DAILY WITH BREAKFAST FOR DEPRESSION     VRAYLAR 1.5 MG capsule TAKE 1 CAPSULE BY MOUTH ONCE DAILY FOR MOOD     oxyCODONE -acetaminophen  (PERCOCET) 5-325 MG tablet Take 1-2 tablets by mouth every 4 (four) hours as needed. 15  tablet 0   No current facility-administered medications for this visit.    ALLERGIES:  Allergies[1]  FAMILY HISTORY:   Family History  Problem Relation Age of Onset   COPD Mother    Diabetes Mother    COPD Father    Bladder Cancer Father    Mental illness Father    Other Half-Sister        DIED AS THE RESULT OF MVA   Mental illness Half-Sister     SOCIAL HISTORY:  The patient was born and raised in [place].  She currently lives in town with her husband of 7 years.  They have 2 children.  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  She has smoked at least a half a pack of cigarettes daily for 14 years.  She denies having any alcohol abuse.  REVIEW OF SYSTEMS:  Review of Systems  Constitutional:  Positive for fatigue. Negative for fever.  HENT:   Negative for hearing loss and sore throat.   Eyes:  Negative for eye problems.  Respiratory:  Negative for chest tightness, cough and hemoptysis.   Cardiovascular:  Negative for chest pain and palpitations.  Gastrointestinal:  Positive for blood in stool. Negative for abdominal distention, abdominal pain, constipation, diarrhea, nausea and vomiting.  Endocrine: Negative for hot  flashes.  Genitourinary:  Negative for difficulty urinating, dysuria, frequency, hematuria and nocturia.   Musculoskeletal:  Positive for arthralgias. Negative for back pain, gait problem and myalgias.  Skin: Negative.  Negative for itching and rash.  Neurological:  Positive for headaches. Negative for dizziness, extremity weakness, gait problem, light-headedness and numbness.  Hematological: Negative.   Psychiatric/Behavioral:  Positive for depression. Negative for suicidal ideas. The patient is nervous/anxious.      PHYSICAL EXAM:  Blood pressure (!) 90/56, pulse (!) 117, temperature 98.7 F (37.1 C), temperature source Oral, resp. rate 14, height 5' 2 (1.575 m), weight 250 lb 8 oz (113.6 kg), SpO2 97%, unknown  if currently breastfeeding. Wt Readings from Last 3 Encounters:  05/17/24 250 lb 8 oz (113.6 kg)  08/10/11 201 lb (91.2 kg) (98%, Z= 2.05)*  05/19/11 201 lb (91.2 kg) (98%, Z= 2.06)*   * Growth percentiles are based on CDC (Girls, 2-20 Years) data.   Body mass index is 45.82 kg/m. Performance status (ECOG): 1 - Symptomatic but completely ambulatory Physical Exam Constitutional:      Appearance: Normal appearance. She is obese. She is not ill-appearing.  HENT:     Mouth/Throat:     Mouth: Mucous membranes are moist.     Pharynx: Oropharynx is clear. No oropharyngeal exudate or posterior oropharyngeal erythema.  Cardiovascular:     Rate and Rhythm: Normal rate and regular rhythm.     Heart sounds: No murmur heard.    No friction rub. No gallop.  Pulmonary:     Effort: Pulmonary effort is normal. No respiratory distress.     Breath sounds: Normal breath sounds. No wheezing, rhonchi or rales.  Abdominal:     General: Bowel sounds are normal. There is no distension.     Palpations: Abdomen is soft. There is hepatomegaly and splenomegaly. There is no mass.     Tenderness: There is no abdominal tenderness.     Comments: Both her liver and her spleen can be palpated at least 10 cm below her bilateral costal margins.    Musculoskeletal:        General: No swelling.     Right lower leg: No edema.     Left lower leg: No edema.  Lymphadenopathy:     Cervical: No cervical adenopathy.     Upper Body:     Right upper body: No supraclavicular or axillary adenopathy.     Left upper body: No supraclavicular or axillary adenopathy.     Lower Body: No right inguinal adenopathy. No left inguinal adenopathy.  Skin:    General: Skin is warm.     Coloration: Skin is not jaundiced.     Findings: No lesion or rash.  Neurological:     General: No focal deficit present.     Mental Status: She is alert and oriented to person, place, and time. Mental status is at baseline.  Psychiatric:        Mood  and Affect: Mood normal.        Behavior: Behavior normal.        Thought Content: Thought content normal.     LABS:      Latest Ref Rng & Units 05/17/2024    2:46 PM 10/16/2016   12:57 AM 10/08/2016    2:25 PM  CBC  WBC 4.0 - 10.5 K/uL 4.1  8.4  8.5   Hemoglobin 12.0 - 15.0 g/dL 9.9  88.5  86.7   Hematocrit 36.0 - 46.0 % 30.7  34.8  40.1   Platelets 150 - 400 K/uL 213  275  224     Latest Reference Range & Units 05/17/24 14:46  Neutrophils % 34  Lymphocytes % 57  Monocytes Relative % 5  Eosinophil % 2  Basophil % 1  Immature Granulocytes % 1      Latest Ref Rng & Units 05/17/2024    2:46 PM 10/16/2016   12:57 AM 10/08/2016    2:25 PM  CMP  Glucose 70 - 99 mg/dL 863  893  89   BUN 6 - 20 mg/dL <5  <5  5   Creatinine 0.44 - 1.00 mg/dL 9.32  9.29  9.38   Sodium 135 - 145 mmol/L 137  139  137   Potassium 3.5 - 5.1 mmol/L 3.6  3.5  4.2   Chloride 98 - 111 mmol/L 105  107  107   CO2 22 - 32 mmol/L 21  22  19    Calcium 8.9 - 10.3 mg/dL 9.4  9.3  9.3   Total Protein 6.5 - 8.1 g/dL 7.9  6.7    Total Bilirubin 0.0 - 1.2 mg/dL 0.7  0.4    Alkaline Phos 38 - 126 U/L 112  66    AST 15 - 41 U/L 187  27    ALT 0 - 44 U/L 113  47     Lab Results  Component Value Date   LDH 233 05/17/2024   Her peripheral smear was pertinent for an increased percentage of lymphocytes, some of which were slightly larger and had an increased amount of cytoplasm.  Her red cells were pertinent for an occasional teardrop red cell.  There was also faint evidence of rouleaux formation.  ASSESSMENT & PLAN:  A 29 y.o. female who I was asked to consult upon for an abnormal peripheral smear that was suspicious for leukemic blasts.  However, per her peripheral smear today, I did not appreciate leukemic blasts being present.  Nevertheless, larger lymphocytes were seen.  Although her white count is normal, her white count differential is abnormal, which includes her having an increase percentage of lymphocytes.  Based  upon such findings, I will have flow cytometry done of her peripheral blood to ensure an underlying lymphoproliferative disorder is not present.  When reviewing other aspects of her CBC, her hemoglobin is definitely lower than previously.  In passing, the patient's husband mentioned how much she routinely consumes ice.  This could be a sign of pica, which is usually related to iron deficiency anemia.  For completeness, her iron parameters will be checked today.  Also, when looking at her labs today, her liver enzymes are at least 4 times elevated.  I will have her undergo an acute hepatitis panel to ensure a viral hepatitis is not present.  I will see this patient back in 1 week to go over all of these labs collected today, which will be used to formulate her next course of action as it pertains to her multiple hematologic findings.  The patient understands all the plans discussed today and is in agreement with them.  I do appreciate Dough, Lamar CROME, MD for his new consult.   Tyronda Vizcarrondo DELENA Kerns, MD           [1] No Known Allergies  "

## 2024-05-17 ENCOUNTER — Other Ambulatory Visit: Payer: Self-pay | Admitting: Oncology

## 2024-05-17 ENCOUNTER — Encounter: Payer: Self-pay | Admitting: Oncology

## 2024-05-17 ENCOUNTER — Inpatient Hospital Stay

## 2024-05-17 ENCOUNTER — Inpatient Hospital Stay: Attending: Oncology | Admitting: Oncology

## 2024-05-17 ENCOUNTER — Telehealth: Payer: Self-pay | Admitting: Oncology

## 2024-05-17 VITALS — BP 90/56 | HR 117 | Temp 98.7°F | Resp 14 | Ht 62.0 in | Wt 250.5 lb

## 2024-05-17 DIAGNOSIS — R634 Abnormal weight loss: Secondary | ICD-10-CM | POA: Diagnosis not present

## 2024-05-17 DIAGNOSIS — F17210 Nicotine dependence, cigarettes, uncomplicated: Secondary | ICD-10-CM | POA: Diagnosis not present

## 2024-05-17 DIAGNOSIS — R5383 Other fatigue: Secondary | ICD-10-CM | POA: Diagnosis not present

## 2024-05-17 DIAGNOSIS — R7989 Other specified abnormal findings of blood chemistry: Secondary | ICD-10-CM

## 2024-05-17 DIAGNOSIS — R61 Generalized hyperhidrosis: Secondary | ICD-10-CM | POA: Insufficient documentation

## 2024-05-17 DIAGNOSIS — R748 Abnormal levels of other serum enzymes: Secondary | ICD-10-CM

## 2024-05-17 LAB — CBC WITH DIFFERENTIAL (CANCER CENTER ONLY)
Abs Immature Granulocytes: 0.03 K/uL (ref 0.00–0.07)
Basophils Absolute: 0 K/uL (ref 0.0–0.1)
Basophils Relative: 1 %
Eosinophils Absolute: 0.1 K/uL (ref 0.0–0.5)
Eosinophils Relative: 2 %
HCT: 30.7 % — ABNORMAL LOW (ref 36.0–46.0)
Hemoglobin: 9.9 g/dL — ABNORMAL LOW (ref 12.0–15.0)
Immature Granulocytes: 1 %
Lymphocytes Relative: 57 %
Lymphs Abs: 2.4 K/uL (ref 0.7–4.0)
MCH: 31.6 pg (ref 26.0–34.0)
MCHC: 32.2 g/dL (ref 30.0–36.0)
MCV: 98.1 fL (ref 80.0–100.0)
Monocytes Absolute: 0.2 K/uL (ref 0.1–1.0)
Monocytes Relative: 5 %
Neutro Abs: 1.4 K/uL — ABNORMAL LOW (ref 1.7–7.7)
Neutrophils Relative %: 34 %
Platelet Count: 213 K/uL (ref 150–400)
RBC: 3.13 MIL/uL — ABNORMAL LOW (ref 3.87–5.11)
RDW: 18.7 % — ABNORMAL HIGH (ref 11.5–15.5)
Smear Review: NORMAL
WBC Count: 4.1 K/uL (ref 4.0–10.5)
nRBC: 0 % (ref 0.0–0.2)

## 2024-05-17 LAB — CMP (CANCER CENTER ONLY)
ALT: 113 U/L — ABNORMAL HIGH (ref 0–44)
AST: 187 U/L (ref 15–41)
Albumin: 4 g/dL (ref 3.5–5.0)
Alkaline Phosphatase: 112 U/L (ref 38–126)
Anion gap: 11 (ref 5–15)
BUN: <5 mg/dL — ABNORMAL LOW (ref 6–20)
CO2: 21 mmol/L — ABNORMAL LOW (ref 22–32)
Calcium: 9.4 mg/dL (ref 8.9–10.3)
Chloride: 105 mmol/L (ref 98–111)
Creatinine: 0.67 mg/dL (ref 0.44–1.00)
GFR, Estimated: >60 mL/min
Glucose, Bld: 136 mg/dL — ABNORMAL HIGH (ref 70–99)
Potassium: 3.6 mmol/L (ref 3.5–5.1)
Sodium: 137 mmol/L (ref 135–145)
Total Bilirubin: 0.7 mg/dL (ref 0.0–1.2)
Total Protein: 7.9 g/dL (ref 6.5–8.1)

## 2024-05-17 LAB — LACTATE DEHYDROGENASE: LDH: 233 U/L (ref 105–235)

## 2024-05-17 NOTE — Progress Notes (Signed)
 CRITICAL VALUE STICKER  CRITICAL VALUE:   AST 187  RECEIVER (on-site recipient of call):  Woodie Kapur RN  DATE & TIME NOTIFIED:   04/16/2025 @ 1540  MESSENGER (representative from lab):  [facility]  MD NOTIFIED:   Dr. Ezzard  TIME OF NOTIFICATION:  1604  RESPONSE:   scheduled to see patient.

## 2024-05-17 NOTE — Telephone Encounter (Signed)
 Patient has been scheduled for follow-up visit per 05/17/2024 LOS.  Pt given an appt calendar with date and time.

## 2024-05-18 DIAGNOSIS — R7989 Other specified abnormal findings of blood chemistry: Secondary | ICD-10-CM

## 2024-05-18 DIAGNOSIS — R748 Abnormal levels of other serum enzymes: Secondary | ICD-10-CM

## 2024-05-18 LAB — IRON AND TIBC
Iron: 70 ug/dL (ref 28–170)
Saturation Ratios: 17 % (ref 10.4–31.8)
TIBC: 410 ug/dL (ref 250–450)
UIBC: 341 ug/dL

## 2024-05-18 LAB — HEPATITIS PANEL, ACUTE
HCV Ab: NONREACTIVE
Hep A IgM: NONREACTIVE
Hep B C IgM: NONREACTIVE
Hepatitis B Surface Ag: NONREACTIVE

## 2024-05-18 LAB — FERRITIN: Ferritin: 148 ng/mL (ref 11–307)

## 2024-05-18 LAB — FOLATE: Folate: 4.6 ng/mL — ABNORMAL LOW

## 2024-05-18 LAB — VITAMIN B12: Vitamin B-12: 321 pg/mL (ref 180–914)

## 2024-05-20 ENCOUNTER — Telehealth: Payer: Self-pay

## 2024-05-20 NOTE — Telephone Encounter (Signed)
 [facility] Clinical Social Work  Clinical Social Work was referred by medical provider for SDOH needs.  Clinical Social Worker contacted patient by phone to offer support and assess for needs.   Patient expressed no current need for food assistance. Made aware of [facility] food pantry. No other needs. Referral will be closed.  Lizbeth Sprague, LCSW  Clinical Social Worker [facility]

## 2024-05-24 LAB — SURGICAL PATHOLOGY

## 2024-05-24 LAB — FLOW CYTOMETRY

## 2024-05-26 ENCOUNTER — Other Ambulatory Visit: Payer: Self-pay | Admitting: Oncology

## 2024-05-26 ENCOUNTER — Inpatient Hospital Stay: Admitting: Oncology

## 2024-05-26 DIAGNOSIS — R7989 Other specified abnormal findings of blood chemistry: Secondary | ICD-10-CM

## 2024-05-26 NOTE — Progress Notes (Signed)
 " [facility] at [facility] [address] [place],  [place]  [address] [phone]  Clinic Day: 05/17/2024  Referring physician: Ofilia Lamar CROME, MD   HISTORY OF PRESENT ILLNESS:  The patient is a 29 y.o. female who I recently began seeing as there was a concern of leukemic blast being in her peripheral smear.  However, upon inspection, this was not seen.  However, atypical lymphocytes were seen.  Furthermore, her white count differential showed a predominance of lymphocytes.  She comes in today to go over the flow cytometry results of her blood.  Also, of note, the patient was found to be anemic.  She comes in today to go over all of her labs to determine the etiology behind this.  Furthermore, labs unexpectedly showed her to have elevated liver transaminases.  Since her last visit, the patient has been doing well.  She denies having any particular changes in her health since her last visit.  PHYSICAL EXAM:  Blood pressure (!) 115/56, pulse 85, temperature 98 F (36.7 C), temperature source Oral, resp. rate 16, height 5' 2 (1.575 m), weight 245 lb 14.4 oz (111.5 kg), SpO2 94%, unknown if currently breastfeeding. Wt Readings from Last 3 Encounters:  05/27/24 245 lb 14.4 oz (111.5 kg)  05/17/24 250 lb 8 oz (113.6 kg)  08/10/11 201 lb (91.2 kg) (98%, Z= 2.05)*   * Growth percentiles are based on CDC (Girls, 2-20 Years) data.   Body mass index is 44.98 kg/m. Performance status (ECOG): 1 - Symptomatic but completely ambulatory Physical Exam Constitutional:      Appearance: Normal appearance. She is obese. She is not ill-appearing.  HENT:     Mouth/Throat:     Mouth: Mucous membranes are moist.     Pharynx: Oropharynx is clear. No oropharyngeal exudate or posterior oropharyngeal erythema.  Cardiovascular:     Rate and Rhythm: Normal rate and regular rhythm.     Heart sounds: No murmur heard.    No friction rub. No gallop.  Pulmonary:     Effort: Pulmonary effort is  normal. No respiratory distress.     Breath sounds: Normal breath sounds. No wheezing, rhonchi or rales.  Abdominal:     General: Bowel sounds are normal. There is no distension.     Palpations: Abdomen is soft. There is hepatomegaly and splenomegaly. There is no mass.     Tenderness: There is no abdominal tenderness.     Comments: Both her liver and her spleen can be palpated at least 10 cm below her bilateral costal margins.    Musculoskeletal:        General: No swelling.     Right lower leg: No edema.     Left lower leg: No edema.  Lymphadenopathy:     Cervical: No cervical adenopathy.     Upper Body:     Right upper body: No supraclavicular or axillary adenopathy.     Left upper body: No supraclavicular or axillary adenopathy.     Lower Body: No right inguinal adenopathy. No left inguinal adenopathy.  Skin:    General: Skin is warm.     Coloration: Skin is not jaundiced.     Findings: No lesion or rash.  Neurological:     General: No focal deficit present.     Mental Status: She is alert and oriented to person, place, and time. Mental status is at baseline.  Psychiatric:        Mood and Affect: Mood normal.  Behavior: Behavior normal.        Thought Content: Thought content normal.    LABS:      Latest Ref Rng & Units 05/27/2024    1:42 PM 05/17/2024    2:46 PM 10/16/2016   12:57 AM  CBC  WBC 4.0 - 10.5 K/uL 4.6  4.1  8.4   Hemoglobin 12.0 - 15.0 g/dL 89.5  9.9  88.5   Hematocrit 36.0 - 46.0 % 33.9  30.7  34.8   Platelets 150 - 400 K/uL 212  213  275     Latest Reference Range & Units 05/27/24 13:42  Neutrophils % 45  Lymphocytes % 44  Monocytes Relative % 6  Eosinophil % 4  Basophil % 1  Immature Granulocytes % 0      Latest Ref Rng & Units 05/27/2024    1:42 PM 05/17/2024    2:46 PM 10/16/2016   12:57 AM  CMP  Glucose 70 - 99 mg/dL 850  863  893   BUN 6 - 20 mg/dL 8  <5  <5   Creatinine 0.44 - 1.00 mg/dL 9.25  9.32  9.29   Sodium 135 - 145 mmol/L 139   137  139   Potassium 3.5 - 5.1 mmol/L 4.0  3.6  3.5   Chloride 98 - 111 mmol/L 106  105  107   CO2 22 - 32 mmol/L 22  21  22    Calcium 8.9 - 10.3 mg/dL 89.7  9.4  9.3   Total Protein 6.5 - 8.1 g/dL 8.4  7.9  6.7   Total Bilirubin 0.0 - 1.2 mg/dL 0.7  0.7  0.4   Alkaline Phos 38 - 126 U/L 99  112  66   AST 15 - 41 U/L 234  187  27   ALT 0 - 44 U/L 156  113  47    Lab Results  Component Value Date   LDH 233 05/17/2024    Latest Reference Range & Units 05/18/24 08:16  Iron 28 - 170 ug/dL 70  UIBC ug/dL 658  TIBC 749 - 549 ug/dL 589  Saturation Ratios 10.4 - 31.8 % 17  Ferritin 11 - 307 ng/mL 148  Folate >5.9 ng/mL 4.6 (L)  Vitamin B12 180 - 914 pg/mL 321  (L): Data is abnormally low  Latest Reference Range & Units 05/18/24 08:16  Hep A Ab, IgM NON REACTIVE  NON REACTIVE  Hepatitis B Surface Ag NON REACTIVE  NON REACTIVE  Hep B Core Ab, IgM NON REACTIVE  NON REACTIVE  HCV Ab NON REACTIVE  NON REACTIVE   Flow cytometry of her peripheral blood revealed the following: DIAGNOSIS:  Peripheral blood, flow cytometry:  -  No immunophenotypic evidence of a lymphoproliferative disorder (i.e. no monoclonal B cells or immunophenotypically abnormal T cells detected).   GATING AND PHENOTYPIC ANALYSIS:   Gated population: Flow cytometric immunophenotyping is performed using antibodies to the antigens listed in the table below. Electronic gates are placed around a cell cluster displaying light scatter properties corresponding to: lymphocytes   Abnormal Cells in gated population: N/A   Phenotype of Abnormal Cells: N/A   ASSESSMENT & PLAN:  A 29 y.o. female who I recently again seen for multiple issues, including abnormal white cells on her peripheral smear, an abnormal white count differential, anemia, and elevated liver transaminases.  I am pleased that the patient's flow cytometry came back negative for any type of lymphoproliferative disorder being present.  Furthermore, I am pleased as her  white count differential has normalized to where she is back to having more neutrophils than lymphocytes.  With respect to her anemia, labs did show her to be folic acid deficient.  Based upon this, I have told the patient to take at least 800 mcg of folate daily to replenish her folic acid stores, which will hopefully lead to an improvement in her hemoglobin over time.  With respect to her elevated liver enzymes, her acute hepatitis panel came back negative.  When reviewing her medication list, the patient has been on Vraylar for the past year, which can cause liver transaminitis.  I did speak to her primary care provider about this, who will follow her liver situation more closely.  Of note, a recent CT scan did show hepatosplenomegaly.  Furthermore, this could be appreciated on her physical exam.  Ultimately, this patient may need to be evaluated by hepatology in the near future.  I will defer that to her primary care office, as it pertains to her transaminitis.  Overall, the patient is doing better from a hematologic perspective.  I will see her back in 3 months for repeat clinical assessment.  The patient understands all the plans discussed today and is in agreement with them.  Yoseph Haile DELENA Kerns, MD         "

## 2024-05-27 ENCOUNTER — Other Ambulatory Visit: Payer: Self-pay

## 2024-05-27 ENCOUNTER — Other Ambulatory Visit: Payer: Self-pay | Admitting: Oncology

## 2024-05-27 ENCOUNTER — Inpatient Hospital Stay

## 2024-05-27 ENCOUNTER — Inpatient Hospital Stay: Admitting: Oncology

## 2024-05-27 VITALS — BP 115/56 | HR 85 | Temp 98.0°F | Resp 16 | Ht 62.0 in | Wt 245.9 lb

## 2024-05-27 DIAGNOSIS — R7989 Other specified abnormal findings of blood chemistry: Secondary | ICD-10-CM

## 2024-05-27 LAB — CBC WITH DIFFERENTIAL (CANCER CENTER ONLY)
Abs Immature Granulocytes: 0.02 K/uL (ref 0.00–0.07)
Basophils Absolute: 0 K/uL (ref 0.0–0.1)
Basophils Relative: 1 %
Eosinophils Absolute: 0.2 K/uL (ref 0.0–0.5)
Eosinophils Relative: 4 %
HCT: 33.9 % — ABNORMAL LOW (ref 36.0–46.0)
Hemoglobin: 10.4 g/dL — ABNORMAL LOW (ref 12.0–15.0)
Immature Granulocytes: 0 %
Lymphocytes Relative: 44 %
Lymphs Abs: 2 K/uL (ref 0.7–4.0)
MCH: 30.9 pg (ref 26.0–34.0)
MCHC: 30.7 g/dL (ref 30.0–36.0)
MCV: 100.6 fL — ABNORMAL HIGH (ref 80.0–100.0)
Monocytes Absolute: 0.3 K/uL (ref 0.1–1.0)
Monocytes Relative: 6 %
Neutro Abs: 2.1 K/uL (ref 1.7–7.7)
Neutrophils Relative %: 45 %
Platelet Count: 212 K/uL (ref 150–400)
RBC: 3.37 MIL/uL — ABNORMAL LOW (ref 3.87–5.11)
RDW: 16 % — ABNORMAL HIGH (ref 11.5–15.5)
WBC Count: 4.6 K/uL (ref 4.0–10.5)
nRBC: 0 % (ref 0.0–0.2)

## 2024-05-27 LAB — CMP (CANCER CENTER ONLY)
ALT: 156 U/L — ABNORMAL HIGH (ref 0–44)
AST: 234 U/L (ref 15–41)
Albumin: 4.2 g/dL (ref 3.5–5.0)
Alkaline Phosphatase: 99 U/L (ref 38–126)
Anion gap: 11 (ref 5–15)
BUN: 8 mg/dL (ref 6–20)
CO2: 22 mmol/L (ref 22–32)
Calcium: 10.2 mg/dL (ref 8.9–10.3)
Chloride: 106 mmol/L (ref 98–111)
Creatinine: 0.74 mg/dL (ref 0.44–1.00)
GFR, Estimated: >60 mL/min
Glucose, Bld: 149 mg/dL — ABNORMAL HIGH (ref 70–99)
Potassium: 4 mmol/L (ref 3.5–5.1)
Sodium: 139 mmol/L (ref 135–145)
Total Bilirubin: 0.7 mg/dL (ref 0.0–1.2)
Total Protein: 8.4 g/dL — ABNORMAL HIGH (ref 6.5–8.1)

## 2024-05-27 NOTE — Progress Notes (Signed)
 CRITICAL VALUE STICKER  CRITICAL VALUE:  AST 234  RECEIVER (on-site recipient of call):  Woodie Kapur RN  DATE & TIME NOTIFIED:   05/27/2024 @ 1427  MESSENGER (representative from lab):  [facility]  MD NOTIFIED:   Dr. Ezzard  TIME OF NOTIFICATION:  1428  RESPONSE: Acknowledged

## 2024-08-26 ENCOUNTER — Inpatient Hospital Stay: Admitting: Oncology

## 2024-08-26 ENCOUNTER — Inpatient Hospital Stay
